# Patient Record
Sex: Male | Born: 1985 | Race: Black or African American | Hispanic: No | Marital: Single | Smoking: Current some day smoker
Health system: Southern US, Community
[De-identification: ages and names within clinical notes are randomized; demographics above are authoritative.]

## PROBLEM LIST (undated history)

## (undated) HISTORY — PX: WRIST FRACTURE SURGERY: SHX121

## (undated) HISTORY — PX: FOOT SURGERY: SHX648

---
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
## 2006-09-18 ENCOUNTER — Emergency Department (HOSPITAL_COMMUNITY): Admission: EM | Admit: 2006-09-18 | Discharge: 2006-09-18 | Payer: Self-pay | Admitting: Emergency Medicine

## 2007-04-13 ENCOUNTER — Emergency Department (HOSPITAL_COMMUNITY): Admission: EM | Admit: 2007-04-13 | Discharge: 2007-04-13 | Payer: Self-pay | Admitting: Emergency Medicine

## 2007-09-01 ENCOUNTER — Emergency Department (HOSPITAL_COMMUNITY): Admission: EM | Admit: 2007-09-01 | Discharge: 2007-09-01 | Payer: Self-pay | Admitting: Emergency Medicine

## 2007-09-10 ENCOUNTER — Ambulatory Visit (HOSPITAL_COMMUNITY): Admission: RE | Admit: 2007-09-10 | Discharge: 2007-09-10 | Payer: Self-pay | Admitting: Orthopedic Surgery
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
## 2008-05-30 ENCOUNTER — Emergency Department (HOSPITAL_COMMUNITY): Admission: EM | Admit: 2008-05-30 | Discharge: 2008-05-30 | Payer: Self-pay | Admitting: Emergency Medicine

## 2008-06-08 ENCOUNTER — Emergency Department (HOSPITAL_COMMUNITY): Admission: EM | Admit: 2008-06-08 | Discharge: 2008-06-08 | Payer: Self-pay | Admitting: Family Medicine

## 2008-08-16 ENCOUNTER — Emergency Department (HOSPITAL_COMMUNITY): Admission: EM | Admit: 2008-08-16 | Discharge: 2008-08-16 | Payer: Self-pay | Admitting: Emergency Medicine

## 2009-04-26 ENCOUNTER — Emergency Department (HOSPITAL_COMMUNITY): Admission: EM | Admit: 2009-04-26 | Discharge: 2009-04-26 | Payer: Self-pay | Admitting: Emergency Medicine

## 2010-09-06 ENCOUNTER — Emergency Department (HOSPITAL_COMMUNITY): Admission: EM | Admit: 2010-09-06 | Discharge: 2010-09-06 | Payer: Self-pay | Admitting: Emergency Medicine
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
## 2011-08-16 LAB — CBC
HCT: 40.4
Hemoglobin: 14.1
RBC: 4.49

## 2011-08-25 LAB — URINALYSIS, ROUTINE W REFLEX MICROSCOPIC
Bilirubin Urine: NEGATIVE
Hgb urine dipstick: NEGATIVE
Ketones, ur: NEGATIVE
Specific Gravity, Urine: 1.024
pH: 6

## 2011-08-25 LAB — URINE MICROSCOPIC-ADD ON

## 2011-08-25 LAB — GC/CHLAMYDIA PROBE AMP, GENITAL: GC Probe Amp, Genital: POSITIVE — AB

## 2013-08-12 ENCOUNTER — Emergency Department (HOSPITAL_COMMUNITY): Admission: EM | Admit: 2013-08-12 | Discharge: 2013-08-12 | Discharge status: Against Medical Advice | Payer: Self-pay

## 2013-10-07 ENCOUNTER — Emergency Department (HOSPITAL_COMMUNITY): Payer: Self-pay

## 2013-10-07 ENCOUNTER — Emergency Department (HOSPITAL_COMMUNITY)
Admission: EM | Admit: 2013-10-07 | Discharge: 2013-10-07 | Disposition: A | Discharge status: Home / Self Care | Payer: Self-pay | Attending: Emergency Medicine | Admitting: Emergency Medicine

## 2013-10-07 ENCOUNTER — Encounter (HOSPITAL_COMMUNITY): Payer: Self-pay | Admitting: Emergency Medicine

## 2013-10-07 DIAGNOSIS — Y9389 Activity, other specified: Secondary | ICD-10-CM | POA: Insufficient documentation

## 2013-10-07 DIAGNOSIS — S62309A Unspecified fracture of unspecified metacarpal bone, initial encounter for closed fracture: Secondary | ICD-10-CM

## 2013-10-07 DIAGNOSIS — S62339A Displaced fracture of neck of unspecified metacarpal bone, initial encounter for closed fracture: Secondary | ICD-10-CM | POA: Insufficient documentation

## 2013-10-07 DIAGNOSIS — F17200 Nicotine dependence, unspecified, uncomplicated: Secondary | ICD-10-CM | POA: Insufficient documentation

## 2013-10-07 DIAGNOSIS — W230XXA Caught, crushed, jammed, or pinched between moving objects, initial encounter: Secondary | ICD-10-CM | POA: Insufficient documentation

## 2013-10-07 DIAGNOSIS — Y929 Unspecified place or not applicable: Secondary | ICD-10-CM | POA: Insufficient documentation

## 2013-10-07 MED ORDER — OXYCODONE-ACETAMINOPHEN 5-325 MG PO TABS
1.0000 | ORAL_TABLET | Freq: Once | ORAL | Status: AC
  Administered 2013-10-07: 1 via ORAL
  Filled 2013-10-07: qty 1

## 2013-10-07 MED ORDER — OXYCODONE-ACETAMINOPHEN 5-325 MG PO TABS: 1.0000 | ORAL_TABLET | ORAL | Status: DC | PRN

## 2013-10-07 NOTE — ED Notes (Signed)
Ortho tech called to apply splint. °
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
## 2013-10-07 NOTE — ED Provider Notes (Signed)
Medical screening examination/treatment/procedure(s) were performed by non-physician practitioner and as supervising physician I was immediately available for consultation/collaboration.  EKG Interpretation   None         Gavin Pound. Oletta Lamas, MD 10/07/13 [phone]

## 2013-10-07 NOTE — ED Provider Notes (Signed)
6:14 AM Patient with possible hand fracture.  Pt to be seen in ED by Dr Amanda Pea.   6:52 AM Pt has been seen by Dr Amanda Pea.  CT pending.  Plan is for volar splint and follow up in his office at 7:30am on Wednesday.    7:49 AM Discussed all results, plan, follow up with patient.  Pt given return precautions.  Pt verbalizes understanding and agrees with plan.     Results for orders placed during the hospital encounter of 09/10/07  CBC      Result Value Range   WBC 6.9     RBC 4.49     Hemoglobin 14.1     HCT 40.4     MCV 90.1     MCHC 34.7     RDW 12.5     Platelets 274     Ct Hand Right Wo Contrast  10/07/2013   CLINICAL DATA:  Right 2nd metacarpal fracture.  EXAM: CT OF THE RIGHT HAND WITHOUT CONTRAST  TECHNIQUE: Multidetector CT imaging was performed according to the standard protocol. Multiplanar CT image reconstructions were also generated.  COMPARISON:  Plain films same day.  FINDINGS: Examination demonstrates a minimally displaced oblique fracture over the mid to distal 2nd metacarpal with extension to the articular surface at the MCP joint. There is approximately 1 cortical with of volar displacement of the distal fragment. There is slight comminution of the fracture at the articular surface. There is mild associated soft tissue swelling. Subtle degenerative change of the 4th MCP joint. Mild bone remodeling suggesting old 4th and 5th metacarpal fractures. Remainder of the exam is unremarkable.  IMPRESSION: Minimally displaced oblique fracture of the distal aspect of the 2nd metacarpal with extension to the articular surface.   Electronically Signed   By: Elberta Fortis M.D.   On: 10/07/2013 07:24   Dg Hand Complete Right  10/07/2013   *RADIOLOGY REPORT*  Clinical Data: Right hand trauma  RIGHT HAND - COMPLETE 3+ VIEW  Comparison: 06/08/2008  Findings: Oblique fracture through the second metacarpal with significant foreshortening and mild radial displacement. Some callus formation is  suspected, therefore this may reflect an acute injury superimposed on a remote trauma.  Evidence of prior fourth metacarpal fracture as well.  No dislocation.  IMPRESSION: Fracture of the second metacarpal as above.   Original Report Authenticated By: Jearld Lesch, M.D.       [place], PA-C 10/07/13 [phone]

## 2014-01-12 ENCOUNTER — Emergency Department (HOSPITAL_COMMUNITY)
Admission: EM | Admit: 2014-01-12 | Discharge: 2014-01-12 | Disposition: A | Discharge status: Home / Self Care | Payer: Self-pay | Attending: Emergency Medicine | Admitting: Emergency Medicine

## 2014-01-12 ENCOUNTER — Encounter (HOSPITAL_COMMUNITY): Payer: Self-pay | Admitting: Emergency Medicine

## 2014-01-12 ENCOUNTER — Emergency Department (HOSPITAL_COMMUNITY): Payer: Self-pay

## 2014-01-12 DIAGNOSIS — Y929 Unspecified place or not applicable: Secondary | ICD-10-CM | POA: Insufficient documentation

## 2014-01-12 DIAGNOSIS — S59919A Unspecified injury of unspecified forearm, initial encounter: Principal | ICD-10-CM

## 2014-01-12 DIAGNOSIS — F17200 Nicotine dependence, unspecified, uncomplicated: Secondary | ICD-10-CM | POA: Insufficient documentation

## 2014-01-12 DIAGNOSIS — S6990XA Unspecified injury of unspecified wrist, hand and finger(s), initial encounter: Principal | ICD-10-CM | POA: Insufficient documentation

## 2014-01-12 DIAGNOSIS — M25549 Pain in joints of unspecified hand: Secondary | ICD-10-CM | POA: Insufficient documentation

## 2014-01-12 DIAGNOSIS — Y939 Activity, unspecified: Secondary | ICD-10-CM | POA: Insufficient documentation

## 2014-01-12 DIAGNOSIS — IMO0002 Reserved for concepts with insufficient information to code with codable children: Secondary | ICD-10-CM | POA: Insufficient documentation

## 2014-01-12 DIAGNOSIS — S6992XA Unspecified injury of left wrist, hand and finger(s), initial encounter: Secondary | ICD-10-CM

## 2014-01-12 DIAGNOSIS — S59909A Unspecified injury of unspecified elbow, initial encounter: Secondary | ICD-10-CM | POA: Insufficient documentation

## 2014-01-12 MED ORDER — HYDROCODONE-ACETAMINOPHEN 5-325 MG PO TABS
2.0000 | ORAL_TABLET | Freq: Once | ORAL | Status: AC
  Administered 2014-01-12: 2 via ORAL
  Filled 2014-01-12: qty 2

## 2014-01-12 MED ORDER — IBUPROFEN 800 MG PO TABS: 800.0000 mg | ORAL_TABLET | Freq: Three times a day (TID) | ORAL | Status: DC

## 2014-01-12 NOTE — ED Notes (Signed)
Significant other at the bedside. Verbalized she would be driving patient home.
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
## 2014-01-12 NOTE — Discharge Instructions (Signed)
Take ibuprofen as needed for pain. Apply ice to the injury. Rest and elevated the injury. Refer to attached documents for more information.

## 2014-01-12 NOTE — ED Provider Notes (Signed)
Medical screening examination/treatment/procedure(s) were performed by non-physician practitioner and as supervising physician I was immediately available for consultation/collaboration.   EKG Interpretation None        Junius ArgyleForrest S Adelie Croswell, MD 01/12/14 2124
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
## 2014-03-23 ENCOUNTER — Encounter (HOSPITAL_COMMUNITY): Payer: Self-pay | Admitting: Emergency Medicine

## 2014-03-23 ENCOUNTER — Emergency Department (HOSPITAL_COMMUNITY): Payer: Self-pay

## 2014-03-23 ENCOUNTER — Emergency Department (HOSPITAL_COMMUNITY)
Admission: EM | Admit: 2014-03-23 | Discharge: 2014-03-23 | Disposition: A | Discharge status: Home / Self Care | Payer: Self-pay | Attending: Emergency Medicine | Admitting: Emergency Medicine

## 2014-03-23 DIAGNOSIS — W108XXA Fall (on) (from) other stairs and steps, initial encounter: Secondary | ICD-10-CM | POA: Insufficient documentation

## 2014-03-23 DIAGNOSIS — F17200 Nicotine dependence, unspecified, uncomplicated: Secondary | ICD-10-CM | POA: Insufficient documentation

## 2014-03-23 DIAGNOSIS — S52609A Unspecified fracture of lower end of unspecified ulna, initial encounter for closed fracture: Secondary | ICD-10-CM

## 2014-03-23 DIAGNOSIS — R296 Repeated falls: Secondary | ICD-10-CM | POA: Insufficient documentation

## 2014-03-23 DIAGNOSIS — S62122A Displaced fracture of lunate [semilunar], left wrist, initial encounter for closed fracture: Secondary | ICD-10-CM

## 2014-03-23 DIAGNOSIS — S62123A Displaced fracture of lunate [semilunar], unspecified wrist, initial encounter for closed fracture: Secondary | ICD-10-CM | POA: Insufficient documentation

## 2014-03-23 DIAGNOSIS — Z9889 Other specified postprocedural states: Secondary | ICD-10-CM | POA: Insufficient documentation

## 2014-03-23 DIAGNOSIS — S52509A Unspecified fracture of the lower end of unspecified radius, initial encounter for closed fracture: Secondary | ICD-10-CM | POA: Insufficient documentation

## 2014-03-23 DIAGNOSIS — Y9389 Activity, other specified: Secondary | ICD-10-CM | POA: Insufficient documentation

## 2014-03-23 DIAGNOSIS — Y9289 Other specified places as the place of occurrence of the external cause: Secondary | ICD-10-CM | POA: Insufficient documentation

## 2014-03-23 MED ORDER — HYDROMORPHONE HCL PF 1 MG/ML IJ SOLN
1.0000 mg | Freq: Once | INTRAMUSCULAR | Status: AC
Start: 2014-03-23 — End: 2014-03-23
  Administered 2014-03-23: 1 mg via INTRAVENOUS
  Filled 2014-03-23: qty 1

## 2014-03-23 MED ORDER — OXYCODONE HCL 5 MG PO TABS: 5.0000 mg | ORAL_TABLET | ORAL | Status: AC | PRN

## 2014-03-23 MED ORDER — HYDROMORPHONE HCL PF 1 MG/ML IJ SOLN
1.0000 mg | Freq: Once | INTRAMUSCULAR | Status: AC
  Administered 2014-03-23: 1 mg via INTRAVENOUS
  Filled 2014-03-23: qty 1

## 2014-03-23 MED ORDER — MORPHINE SULFATE 4 MG/ML IJ SOLN
4.0000 mg | Freq: Once | INTRAMUSCULAR | Status: AC
  Administered 2014-03-23: 4 mg via INTRAVENOUS
  Filled 2014-03-23: qty 1
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
## 2014-03-23 NOTE — ED Notes (Signed)
Patient transported to XR. 

## 2014-03-23 NOTE — ED Notes (Signed)
Patient is off the unit.

## 2014-03-23 NOTE — Discharge Instructions (Signed)
Call to make an appointment with the hand surgeon for Wednesday.  Take oxycodone for breakthrough pain, do not drink alcohol, drive, care for children or do other critical tasks while taking oxycodone.  Do not hesitate to return to the Emergency Department for any new, worsening or concerning symptoms.   If you do not have a primary care doctor you can establish one at the   [facility]: [address] [place] [id number] [phone]  After you establish care. Let them know you were seen in the emergency room. They must obtain records for further management.    Wrist Fracture A wrist fracture is a break in one of the bones of the wrist. Your wrist is made up of several small bones at the palm of your hand (carpal bones) and the two bones that make up your forearm (radius and ulna). The bones come together to form multiple large and small joints. The shape and design of these joints allow your wrist to bend and straighten, move side-to-side, and rotate, as in twisting your palm up or down. CAUSES  A fracture may occur in any of the bones in your wrist when enough force is applied to the wrist, such as when falling down onto an outstretched hand. Severe injuries may occur from a more forceful injury. SYMPTOMS Symptoms of wrist fractures include tenderness, bruising, and swelling. Additionally, the wrist may hang in an odd position or may be misshaped. DIAGNOSIS To diagnose a wrist fracture, your caregiver will physically examine your wrist. Your caregiver may also request an X-ray exam of your wrist. TREATMENT Treatment depends on many factors, including the nature and location of the fracture, your age, and your activity level. Treatment for wrist fracture can be nonsurgical or surgical. For nonsurgical treatment, a plaster cast or splint may be applied to your wrist if the bone is in a good position (aligned). The cast will stay on for about 6 weeks. If the alignment of  your bone is not good, it may be necessary to realign (reduce) it. After the bone is reduced, a splint usually is placed on your wrist to allow for a small amount of normal swelling. After about 1 week, the splint is removed and a cast is added. The cast is removed 2 or 3 weeks later, after the swelling goes down, causing the cast to loosen. Another cast is applied. This cast is removed after about another 2 or 3 weeks, for a total of 4 to 6 weeks of immobilization. Sometimes the position of the bone is so far out of place that surgery is required to apply a device to hold it together as it heals. If the bone cannot be reduced without cutting the skin around the bone (closed reduction), a cut (incision) is made to allow direct access to the bone to reduce it (open reduction). Depending on the fracture, there are a number of options for holding the bone in place while it heals, including a cast, metal pins, a plate and screws, and a device called an external fixator. With an external fixator, most of the hardware remains outside of the body. HOME CARE INSTRUCTIONS  To lessen swelling, keep your injured wrist elevated and move your fingers as much as possible.  Apply ice to your wrist for the first 1 to 2 days after you have been treated or as directed by your caregiver. Applying ice helps to reduce inflammation and pain.  Put ice in a plastic bag.  Place  a towel between your skin and the bag.  Leave the ice on for 15 to 20 minutes at a time, every 2 hours while you are awake.  Do not put pressure on any part of your cast or splint. It may break.  Use a plastic bag to protect your cast or splint from water while bathing or showering. Do not lower your cast or splint into water.  Only take over-the-counter or prescription medicines for pain as directed by your caregiver. SEEK IMMEDIATE MEDICAL CARE IF:   Your cast or splint gets damaged or breaks.  You have continued severe pain or more swelling  than you did before the cast was put on.  Your skin or fingernails below the injury turn blue or gray or feel cold or numb.  You develop decreased feeling in your fingers. MAKE SURE YOU:  Understand these instructions.  Will watch your condition.  Will get help right away if you are not doing well or get worse. Document Released: 08/03/2005 Document Revised: 01/16/2012 Document Reviewed: 11/11/2011 [facility] Patient Information 2014 [place] [place].

## 2014-03-23 NOTE — ED Notes (Signed)
After administration of pain medication 02sats 89% ra. 2LPM via nasal cannula applied now 02sats 99%.

## 2014-03-25 NOTE — ED Provider Notes (Signed)
Medical screening examination/treatment/procedure(s) were performed by non-physician practitioner and as supervising physician I was immediately available for consultation/collaboration.   EKG Interpretation None        Honore Wipperfurth W. Kooper Godshall, MD 03/25/14 0733 

## 2014-08-26 ENCOUNTER — Other Ambulatory Visit (HOSPITAL_COMMUNITY)
Admission: RE | Admit: 2014-08-26 | Discharge: 2014-08-26 | Disposition: A | Discharge status: Home / Self Care | Payer: Self-pay | Source: Ambulatory Visit | Attending: Family Medicine | Admitting: Family Medicine

## 2014-08-26 ENCOUNTER — Emergency Department (INDEPENDENT_AMBULATORY_CARE_PROVIDER_SITE_OTHER)
Admission: EM | Admit: 2014-08-26 | Discharge: 2014-08-26 | Disposition: A | Discharge status: Home / Self Care | Payer: Self-pay | Source: Home / Self Care

## 2014-08-26 ENCOUNTER — Encounter (HOSPITAL_COMMUNITY): Payer: Self-pay | Admitting: Emergency Medicine

## 2014-08-26 DIAGNOSIS — Z113 Encounter for screening for infections with a predominantly sexual mode of transmission: Secondary | ICD-10-CM | POA: Insufficient documentation

## 2014-08-26 DIAGNOSIS — Z202 Contact with and (suspected) exposure to infections with a predominantly sexual mode of transmission: Secondary | ICD-10-CM

## 2014-08-26 DIAGNOSIS — R369 Urethral discharge, unspecified: Secondary | ICD-10-CM

## 2014-08-26 DIAGNOSIS — R300 Dysuria: Secondary | ICD-10-CM

## 2014-08-26 LAB — POCT URINALYSIS DIP (DEVICE)
Bilirubin Urine: NEGATIVE
Glucose, UA: NEGATIVE mg/dL
KETONES UR: NEGATIVE mg/dL
Nitrite: NEGATIVE
PH: 6 (ref 5.0–8.0)
PROTEIN: NEGATIVE mg/dL
Specific Gravity, Urine: 1.025 (ref 1.005–1.030)
Urobilinogen, UA: 0.2 mg/dL (ref 0.0–1.0)

## 2014-08-26 MED ORDER — AZITHROMYCIN 250 MG PO TABS
1000.0000 mg | ORAL_TABLET | Freq: Every day | ORAL | Status: DC
  Administered 2014-08-26 (×2): 1000 mg via ORAL

## 2014-08-26 MED ORDER — CEFTRIAXONE SODIUM 250 MG IJ SOLR
INTRAMUSCULAR | Status: AC
  Filled 2014-08-26: qty 250

## 2014-08-26 MED ORDER — LIDOCAINE HCL (PF) 1 % IJ SOLN
INTRAMUSCULAR | Status: AC
  Filled 2014-08-26: qty 5

## 2014-08-26 MED ORDER — AZITHROMYCIN 250 MG PO TABS
ORAL_TABLET | ORAL | Status: AC
  Filled 2014-08-26: qty 4

## 2014-08-26 MED ORDER — CEFTRIAXONE SODIUM 250 MG IJ SOLR
250.0000 mg | Freq: Once | INTRAMUSCULAR | Status: AC
  Administered 2014-08-26: 250 mg via INTRAMUSCULAR

## 2014-08-26 NOTE — ED Provider Notes (Signed)
CSN: [id number]     Arrival date & time 08/26/14  1509 History   First MD Initiated Contact with Patient 08/26/14 1521     Chief Complaint  Patient presents with  . Penile Discharge   (Consider location/radiation/quality/duration/timing/severity/associated sxs/prior Treatment) HPI Comments: C/O penile discharge and burning with urination since last PM. Sexually active. Partners have not reported any known STD's   History reviewed. No pertinent past medical history. Past Surgical History  Procedure Laterality Date  . Wrist fracture surgery     History reviewed. No pertinent family history. History  Substance Use Topics  . Smoking status: Current Some Day Smoker -- 0.50 packs/day  . Smokeless tobacco: Not on file  . Alcohol Use: Yes    Review of Systems  Constitutional: Negative for fever and fatigue.  Respiratory: Negative.   Gastrointestinal: Negative for nausea, vomiting and abdominal pain.  Genitourinary: Positive for dysuria and discharge. Negative for urgency, frequency, flank pain, decreased urine volume, penile swelling, scrotal swelling, genital sores, penile pain and testicular pain.  Skin: Negative.     Allergies  Review of patient's allergies indicates no known allergies.  Home Medications   Prior to Admission medications   Medication Sig Start Date End Date Taking? Authorizing Provider  oxyCODONE (ROXICODONE) 5 MG immediate release tablet Take 1 tablet (5 mg total) by mouth every 4 (four) hours as needed. Take 1-2 tablets every 4-6 hours as needed for pain control 03/23/14   Joni ReiningNicole Pisciotta, PA-C   BP 137/82  Pulse 83  Temp(Src) 98.7 F (37.1 C) (Oral)  Resp 20  SpO2 99% Physical Exam  Nursing note and vitals reviewed. Constitutional: He is oriented to person, place, and time. He appears well-developed and well-nourished. No distress.  Neck: Normal range of motion.  Pulmonary/Chest: Effort normal. No respiratory distress.  Genitourinary:  NEMG. No  observed penile or scrotal lesions. No expressed urethral D/C. No genital tenderness or testicular enlargement. No abnormal scrotal contents or tenderness. No inguinal tenderness or palpable lymph nodes. No swelling or discoloration.  Neurological: He is alert and oriented to person, place, and time. He exhibits normal muscle tone.  Skin: Skin is warm and dry.  Psychiatric: He has a normal mood and affect.    ED Course  Procedures (including critical care time) Labs Review Labs Reviewed  POCT URINALYSIS DIP (DEVICE) - Abnormal; Notable for the following:    Hgb urine dipstick TRACE (*)    Leukocytes, UA SMALL (*)    All other components within normal limits  URINE CYTOLOGY ANCILLARY ONLY   Results for orders placed during the hospital encounter of 08/26/14  POCT URINALYSIS DIP (DEVICE)      Result Value Ref Range   Glucose, UA NEGATIVE  NEGATIVE mg/dL   Bilirubin Urine NEGATIVE  NEGATIVE   Ketones, ur NEGATIVE  NEGATIVE mg/dL   Specific Gravity, Urine 1.025  1.005 - 1.030   Hgb urine dipstick TRACE (*) NEGATIVE   pH 6.0  5.0 - 8.0   Protein, ur NEGATIVE  NEGATIVE mg/dL   Urobilinogen, UA 0.2  0.0 - 1.0 mg/dL   Nitrite NEGATIVE  NEGATIVE   Leukocytes, UA SMALL (*) NEGATIVE    Imaging Review No results found.   MDM   1. Potential exposure to STD   2. Abnormal penile discharge   3. Dysuria    Rocephin 250 mg IM Azithromycin 1gm po now Urine cytology pending Urine culture pending.    Hayden Rasmussenavid Ame Heagle, NP 08/26/14 [phone]

## 2014-08-26 NOTE — Discharge Instructions (Signed)
Sexually Transmitted Disease °A sexually transmitted disease (STD) is a disease or infection that may be passed (transmitted) from person to person, usually during sexual activity. This may happen by way of saliva, semen, blood, vaginal mucus, or urine. Common STDs include:  °· Gonorrhea.   °· Chlamydia.   °· Syphilis.   °· HIV and AIDS.   °· Genital herpes.   °· Hepatitis B and C.   °· Trichomonas.   °· Human papillomavirus (HPV).   °· Pubic lice.   °· Scabies. °· Mites. °· Bacterial vaginosis. °WHAT ARE CAUSES OF STDs? °An STD may be caused by bacteria, a virus, or parasites. STDs are often transmitted during sexual activity if one person is infected. However, they may also be transmitted through nonsexual means. STDs may be transmitted after:  °· Sexual intercourse with an infected person.   °· Sharing sex toys with an infected person.   °· Sharing needles with an infected person or using unclean piercing or tattoo needles. °· Having intimate contact with the genitals, mouth, or rectal areas of an infected person.   °· Exposure to infected fluids during birth. °WHAT ARE THE SIGNS AND SYMPTOMS OF STDs? °Different STDs have different symptoms. Some people may not have any symptoms. If symptoms are present, they may include:  °· Painful or bloody urination.   °· Pain in the pelvis, abdomen, vagina, anus, throat, or eyes.   °· A skin rash, itching, or irritation. °· Growths, ulcerations, blisters, or sores in the genital and anal areas. °· Abnormal vaginal discharge with or without bad odor.   °· Penile discharge in men.   °· Fever.   °· Pain or bleeding during sexual intercourse.   °· Swollen glands in the groin area.   °· Yellow skin and eyes (jaundice). This is seen with hepatitis.   °· Swollen testicles. °· Infertility. °· Sores and blisters in the mouth. °HOW ARE STDs DIAGNOSED? °To make a diagnosis, your health care provider may:  °· Take a medical history.   °· Perform a physical exam.   °· Take a sample of  any discharge to examine. °· Swab the throat, cervix, opening to the penis, rectum, or vagina for testing. °· Test a sample of your first morning urine.   °· Perform blood tests.   °· Perform a Pap test, if this applies.   °· Perform a colposcopy.   °· Perform a laparoscopy.   °HOW ARE STDs TREATED? ° Treatment depends on the STD. Some STDs may be treated but not cured.  °· Chlamydia, gonorrhea, trichomonas, and syphilis can be cured with antibiotic medicine.   °· Genital herpes, hepatitis, and HIV can be treated, but not cured, with prescribed medicines. The medicines lessen symptoms.   °· Genital warts from HPV can be treated with medicine or by freezing, burning (electrocautery), or surgery. Warts may come back.   °· HPV cannot be cured with medicine or surgery. However, abnormal areas may be removed from the cervix, vagina, or vulva.   °· If your diagnosis is confirmed, your recent sexual partners need treatment. This is true even if they are symptom-free or have a negative culture or evaluation. They should not have sex until their health care providers say it is okay. °HOW CAN I REDUCE MY RISK OF GETTING AN STD? °Take these steps to reduce your risk of getting an STD: °· Use latex condoms, dental dams, and water-soluble lubricants during sexual activity. Do not use petroleum jelly or oils. °· Avoid having multiple sex partners. °· Do not have sex with someone who has other sex partners. °· Do not have sex with anyone you do not know or who is at   high risk for an STD. °· Avoid risky sex practices that can break your skin. °· Do not have sex if you have open sores on your mouth or skin. °· Avoid drinking too much alcohol or taking illegal drugs. Alcohol and drugs can affect your judgment and put you in a vulnerable position. °· Avoid engaging in oral and anal sex acts. °· Get vaccinated for HPV and hepatitis. If you have not received these vaccines in the past, talk to your health care provider about whether one  or both might be right for you.   °· If you are at risk of being infected with HIV, it is recommended that you take a prescription medicine daily to prevent HIV infection. This is called pre-exposure prophylaxis (PrEP). You are considered at risk if: °¨ You are a man who has sex with other men (MSM). °¨ You are a heterosexual man or woman and are sexually active with more than one partner. °¨ You take drugs by injection. °¨ You are sexually active with a partner who has HIV. °· Talk with your health care provider about whether you are at high risk of being infected with HIV. If you choose to begin PrEP, you should first be tested for HIV. You should then be tested every 3 months for as long as you are taking PrEP.   °WHAT SHOULD I DO IF I THINK I HAVE AN STD? °· See your health care provider.   °· Tell your sexual partner(s). They should be tested and treated for any STDs. °· Do not have sex until your health care provider says it is okay.  °WHEN SHOULD I GET IMMEDIATE MEDICAL CARE? °Contact your health care provider right away if:  °· You have severe abdominal pain. °· You are a man and notice swelling or pain in your testicles. °· You are a woman and notice swelling or pain in your vagina. °Document Released: 01/14/2003 Document Revised: 10/29/2013 Document Reviewed: 05/14/2013 °ExitCare® Patient Information ©2015 [employer]. This information is not intended to replace advice given to you by your health care provider. Make sure you discuss any questions you have with your health care provider. ° °Safe Sex °Safe sex is about reducing the risk of giving or getting a sexually transmitted disease (STD). STDs are spread through sexual contact involving the genitals, mouth, or rectum. Some STDs can be cured and others cannot. Safe sex can also prevent unintended pregnancies.  °WHAT ARE SOME SAFE SEX PRACTICES? °· Limit your sexual activity to only one partner who is having sex with only you. °· Talk to your partner  about his or her past partners, past STDs, and drug use. °· Use a condom every time you have sexual intercourse. This includes vaginal, oral, and anal sexual activity. Both females and males should wear condoms during oral sex. Only use latex or polyurethane condoms and water-based lubricants. Using petroleum-based lubricants or oils to lubricate a condom will weaken the condom and increase the chance that it will break. The condom should be in place from the beginning to the end of sexual activity. Wearing a condom reduces, but does not completely eliminate, your risk of getting or giving an STD. STDs can be spread by contact with infected body fluids and skin. °· Get vaccinated for hepatitis B and HPV. °· Avoid alcohol and recreational drugs, which can affect your judgment. You may forget to use a condom or participate in high-risk sex. °· For females, avoid douching after sexual intercourse. Douching can spread an infection   farther into the reproductive tract. °· Check your body for signs of sores, blisters, rashes, or unusual discharge. See your health care provider if you notice any of these signs. °· Avoid sexual contact if you have symptoms of an infection or are being treated for an STD. If you or your partner has herpes, avoid sexual contact when blisters are present. Use condoms at all other times. °· If you are at risk of being infected with HIV, it is recommended that you take a prescription medicine daily to prevent HIV infection. This is called pre-exposure prophylaxis (PrEP). You are considered at risk if: °¨ You are a man who has sex with other men (MSM). °¨ You are a heterosexual man or woman who is sexually active with more than one partner. °¨ You take drugs by injection. °¨ You are sexually active with a partner who has HIV. °· Talk with your health care provider about whether you are at high risk of being infected with HIV. If you choose to begin PrEP, you should first be tested for HIV. You  should then be tested every 3 months for as long as you are taking PrEP. °· See your health care provider for regular screenings, exams, and tests for other STDs. Before having sex with a new partner, each of you should be screened for STDs and should talk about the results with each other. °WHAT ARE THE BENEFITS OF SAFE SEX?  °· There is less chance of getting or giving an STD. °· You can prevent unwanted or unintended pregnancies. °· By discussing safe sex concerns with your partner, you may increase feelings of intimacy, comfort, trust, and honesty between the two of you. °Document Released: 12/01/2004 Document Revised: 03/10/2014 Document Reviewed: 04/16/2012 °ExitCare® Patient Information ©2015 [employer]. This information is not intended to replace advice given to you by your health care provider. Make sure you discuss any questions you have with your health care provider. ° °

## 2014-08-26 NOTE — ED Notes (Signed)
C/o pelvic and abdominal pain x 2 days.  On set of penile discharge last night.  Has concerns for std exposure.  Denies fever, n/v/d

## 2014-08-27 ENCOUNTER — Telehealth (HOSPITAL_COMMUNITY): Payer: Self-pay | Admitting: *Deleted

## 2014-08-27 LAB — URINE CULTURE
Colony Count: NO GROWTH
Culture: NO GROWTH
SPECIAL REQUESTS: NORMAL

## 2014-08-27 LAB — URINE CYTOLOGY ANCILLARY ONLY
Chlamydia: POSITIVE — AB
Neisseria Gonorrhea: POSITIVE — AB
Trichomonas: NEGATIVE

## 2014-08-27 NOTE — ED Notes (Signed)
GC/Chlamydia pos., Urine culture: No growth. I called pt. Pt. verified x 2 and given results.  Pt. told that he was adequately treated with Rocephin and Zithromax. Pt. instructed to notify his partner, no sex for 1 week and to practice safe sex. Pt. told [facility], by appointment.  Pt. voiced understanding. DHHS forms x 2 completed and [facility]. Vassie MoselleYork, Haron Beilke M 08/27/2014

## 2014-08-29 NOTE — ED Provider Notes (Signed)
Medical screening examination/treatment/procedure(s) were performed by resident physician or non-physician practitioner and as supervising physician I was immediately available for consultation/collaboration.   Ruvim Risko DOUGLAS MD.   Jayanna Kroeger D Shahram Alexopoulos, MD 08/29/14 1003 

## 2016-04-20 ENCOUNTER — Emergency Department (HOSPITAL_COMMUNITY)
Admission: EM | Admit: 2016-04-20 | Discharge: 2016-04-20 | Disposition: A | Discharge status: Home / Self Care | Payer: Self-pay | Attending: Emergency Medicine | Admitting: Emergency Medicine

## 2016-04-20 ENCOUNTER — Encounter (HOSPITAL_COMMUNITY): Payer: Self-pay | Admitting: *Deleted

## 2016-04-20 DIAGNOSIS — J3489 Other specified disorders of nose and nasal sinuses: Secondary | ICD-10-CM | POA: Insufficient documentation

## 2016-04-20 DIAGNOSIS — F17200 Nicotine dependence, unspecified, uncomplicated: Secondary | ICD-10-CM | POA: Insufficient documentation

## 2016-04-20 DIAGNOSIS — H109 Unspecified conjunctivitis: Secondary | ICD-10-CM | POA: Insufficient documentation

## 2016-04-20 MED ORDER — NAPHAZOLINE-PHENIRAMINE 0.025-0.3 % OP SOLN: 1.0000 [drp] | OPHTHALMIC | Status: AC | PRN

## 2016-04-20 NOTE — Discharge Instructions (Signed)
Please take your medications as prescribed. Your symptoms are likely due to a virus and will resolve on her own. If your symptoms persist over the next week, return to ED for your PCP for reevaluation. Return sooner for new or worsening symptoms.  How to Use Eye Drops and Eye Ointments HOW TO APPLY EYE DROPS Follow these steps when applying eye drops: 1. Wash your hands. 2. Tilt your head back. 3. Put a finger under your eye and use it to gently pull your lower lid downward. Keep that finger in place. 4. Using your other hand, hold the dropper between your thumb and index finger. 5. Position the dropper just over the edge of the lower lid. Hold it as close to your eye as you can without touching the dropper to your eye. 6. Steady your hand. One way to do this is to lean your index finger against your brow. 7. Look up. 8. Slowly and gently squeeze one drop of medicine into your eye. 9. Close your eye. 10. Place a finger between your lower eyelid and your nose. Press gently for 2 minutes. This increases the amount of time that the medicine is exposed to the eye. It also reduces side effects that can develop if the drop gets into the bloodstream through the nose. HOW TO APPLY EYE OINTMENTS Follow these steps when applying eye ointments: 1. Wash your hands. 2. Put a finger under your eye and use it to gently pull your lower lid downward. Keep that finger in place. 3. Using your other hand, place the tip of the tube between your thumb and index finger with the remaining fingers braced against your cheek or nose. 4. Hold the tube just over the edge of your lower lid without touching the tube to your lid or eyeball. 5. Look up. 6. Line the inner part of your lower lid with ointment. 7. Gently pull up on your upper lid and look down. This will force the ointment to spread over the surface of the eye. 8. Release the upper lid. 9. If you can, close your eyes for 1-2 minutes. Do not rub your eyes. If  you applied the ointment correctly, your vision will be blurry for a few minutes. This is normal. ADDITIONAL INFORMATION  Make sure to use the eye drops or ointment as told by your health care provider.  If you have been told to use both eye drops and an eye ointment, apply the eye drops first, then wait 3-4 minutes before you apply the ointment.  Try not to touch the tip of the dropper or tube to your eye. A dropper or tube that has touched the eye can become contaminated.   This information is not intended to replace advice given to you by your health care provider. Make sure you discuss any questions you have with your health care provider.   Document Released: 01/30/2001 Document Revised: 03/10/2015 Document Reviewed: 10/20/2014 Elsevier Interactive Patient Education [employer]2016 Elsevier Inc.

## 2016-04-20 NOTE — ED Notes (Signed)
Pt complains of pain, itching, redness to left eye since this morning, worse for the past hour. Pt denies vision deficits at this time. Pt denies injury to eye.

## 2016-04-20 NOTE — ED Provider Notes (Signed)
CSN: [id number]     Arrival date & time 04/20/16  1904 History  By signing my name below, I, Phillis HaggisGabriella Gaje, attest that this documentation has been prepared under the direction and in the presence of [employer] MillsBenjamin Alilah Mcmeans, PA-C. Electronically Signed: Phillis HaggisGabriella Gaje, ED Scribe. 04/20/2016. 9:11 PM.   Chief Complaint  Patient presents with  . Eye Pain   The history is provided by the patient. No language interpreter was used.  HPI Comments: Patrick Gay is a 30 y.o. male who presents to the Emergency Department complaining of throbbing, burning left eye pain and redness onset earlier this morning. Pt reports worsening pain over the past hour. He reports associated itching and watery discharge from the eye. He also states rhinorrhea when his eyes have clear discharge. He has used OTC eye drops to the eye to no relief. Pt does not wear glasses or contacts. He denies fever, chills, chest pain, SOB, abdominal pain, visual changes or injury to the eye. He denies hx of known allergies.   History reviewed. No pertinent past medical history. Past Surgical History  Procedure Laterality Date  . Wrist fracture surgery     No family history on file. Social History  Substance Use Topics  . Smoking status: Current Some Day Smoker -- 0.50 packs/day  . Smokeless tobacco: None  . Alcohol Use: Yes    Review of Systems  Constitutional: Negative for fever and chills.  HENT: Positive for rhinorrhea.   Eyes: Positive for pain, redness and itching. Negative for visual disturbance.  Respiratory: Negative for shortness of breath.   Cardiovascular: Negative for chest pain.  Gastrointestinal: Negative for abdominal pain.  All other systems reviewed and are negative.   Allergies  Review of patient's allergies indicates no known allergies.  Home Medications   Prior to Admission medications   Medication Sig Start Date End Date Taking? Authorizing Provider  naphazoline-pheniramine (NAPHCON-A) 0.025-0.3 %  ophthalmic solution Place 1 drop into the left eye every 4 (four) hours as needed for irritation. 04/20/16   Joycie PeekBenjamin Ladislao Cohenour, PA-C  oxyCODONE (ROXICODONE) 5 MG immediate release tablet Take 1 tablet (5 mg total) by mouth every 4 (four) hours as needed. Take 1-2 tablets every 4-6 hours as needed for pain control 03/23/14   Joni ReiningNicole Pisciotta, PA-C   BP 150/102 mmHg  Pulse 57  Temp(Src) 98.6 F (37 C) (Oral)  Resp 18  SpO2 98% Physical Exam  Constitutional: He is oriented to person, place, and time. He appears well-developed and well-nourished. No distress.  HENT:  Head: Normocephalic and atraumatic.  Mouth/Throat: Oropharynx is clear and moist. No oropharyngeal exudate.  Eyes: Conjunctivae are normal. Pupils are equal, round, and reactive to light. Right eye exhibits no nystagmus. Left eye exhibits no nystagmus.  Left eye: mild bulbar conjunctivitis; no consensual pain with light; no overt pain; clear, watery discharge  Neck: Normal range of motion. Neck supple.  Musculoskeletal: Normal range of motion.  Neurological: He is alert and oriented to person, place, and time.  Skin: Skin is warm and dry.  Psychiatric: He has a normal mood and affect. His behavior is normal.  Nursing note and vitals reviewed.   ED Course  Procedures (including critical care time) DIAGNOSTIC STUDIES: Oxygen Saturation is 98% on RA, normal by my interpretation.    COORDINATION OF CARE: 9:10 PM-Discussed treatment plan which includes eye drops with pt at bedside and pt agreed to plan.    Labs Review Labs Reviewed - No data to display  Imaging Review  No results found. I have personally reviewed and evaluated these images and lab results as part of my medical decision-making.   EKG Interpretation None      MDM    Pt dx likely viral conjunctivitis based on presentation & eye exam, no antibiotics are indicated.  No evidence of HSV or VSV infection. Pt is not a contact lens wearer.  Presentation  non-concerning for orbital cellulitis, hyphema, corneal ulcers, corneal abrasions or trauma.  Patient will be discharged home with naphazoline drops every 1 to 2 hours for pruritus.  Patient has been instructed to use cool compresses and practice personal hygiene with frequent hand washing.  Patient understands to follow up with ophthalmology, especially if new symptoms including change in vision, purulent drainage, or entrapment occur.    Final diagnoses:  Conjunctivitis of left eye   I personally performed the services described in this documentation, which was scribed in my presence. The recorded information has been reviewed and is accurate.    Joycie Peek, PA-C 04/20/16 2124  Cathren Laine, MD 04/21/16 [phone]

## 2016-07-18 ENCOUNTER — Emergency Department (HOSPITAL_COMMUNITY)
Admission: EM | Admit: 2016-07-18 | Discharge: 2016-07-18 | Disposition: A | Discharge status: Home / Self Care | Payer: Self-pay | Attending: Emergency Medicine | Admitting: Emergency Medicine

## 2016-07-18 ENCOUNTER — Emergency Department (HOSPITAL_COMMUNITY): Payer: Self-pay

## 2016-07-18 DIAGNOSIS — W228XXA Striking against or struck by other objects, initial encounter: Secondary | ICD-10-CM | POA: Insufficient documentation

## 2016-07-18 DIAGNOSIS — Y92838 Other recreation area as the place of occurrence of the external cause: Secondary | ICD-10-CM | POA: Insufficient documentation

## 2016-07-18 DIAGNOSIS — R519 Headache, unspecified: Secondary | ICD-10-CM

## 2016-07-18 DIAGNOSIS — Y939 Activity, unspecified: Secondary | ICD-10-CM | POA: Insufficient documentation

## 2016-07-18 DIAGNOSIS — F17200 Nicotine dependence, unspecified, uncomplicated: Secondary | ICD-10-CM | POA: Insufficient documentation

## 2016-07-18 DIAGNOSIS — Y999 Unspecified external cause status: Secondary | ICD-10-CM | POA: Insufficient documentation

## 2016-07-18 DIAGNOSIS — R51 Headache: Secondary | ICD-10-CM

## 2016-07-18 DIAGNOSIS — S0083XA Contusion of other part of head, initial encounter: Secondary | ICD-10-CM | POA: Insufficient documentation

## 2016-07-18 MED ORDER — NAPROXEN 500 MG PO TABS: 500.0000 mg | ORAL_TABLET | Freq: Two times a day (BID) | ORAL | 0 refills | Status: AC

## 2016-07-18 MED ORDER — OXYCODONE-ACETAMINOPHEN 5-325 MG PO TABS
1.0000 | ORAL_TABLET | Freq: Once | ORAL | Status: AC
  Administered 2016-07-18: 1 via ORAL
  Filled 2016-07-18: qty 1
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
## 2018-12-20 ENCOUNTER — Encounter (HOSPITAL_COMMUNITY): Payer: Self-pay | Admitting: *Deleted

## 2018-12-20 ENCOUNTER — Other Ambulatory Visit: Payer: Self-pay

## 2018-12-20 DIAGNOSIS — R369 Urethral discharge, unspecified: Secondary | ICD-10-CM | POA: Insufficient documentation

## 2018-12-20 DIAGNOSIS — Z113 Encounter for screening for infections with a predominantly sexual mode of transmission: Secondary | ICD-10-CM | POA: Insufficient documentation

## 2018-12-20 DIAGNOSIS — F17200 Nicotine dependence, unspecified, uncomplicated: Secondary | ICD-10-CM | POA: Insufficient documentation

## 2018-12-20 DIAGNOSIS — Z79899 Other long term (current) drug therapy: Secondary | ICD-10-CM | POA: Insufficient documentation

## 2018-12-20 NOTE — ED Triage Notes (Signed)
Pt noticed yellow discharge form penis last night. ? STD

## 2018-12-21 ENCOUNTER — Emergency Department (HOSPITAL_COMMUNITY)
Admission: EM | Admit: 2018-12-21 | Discharge: 2018-12-21 | Disposition: A | Discharge status: Home / Self Care | Payer: Self-pay | Attending: Emergency Medicine | Admitting: Emergency Medicine

## 2018-12-21 DIAGNOSIS — Z113 Encounter for screening for infections with a predominantly sexual mode of transmission: Secondary | ICD-10-CM

## 2018-12-21 DIAGNOSIS — R369 Urethral discharge, unspecified: Secondary | ICD-10-CM

## 2018-12-21 LAB — GC/CHLAMYDIA PROBE AMP (~~LOC~~) NOT AT ARMC
Chlamydia: NEGATIVE
Chlamydia: NEGATIVE
NEISSERIA GONORRHEA: POSITIVE — AB
Neisseria Gonorrhea: POSITIVE — AB

## 2018-12-21 LAB — URINALYSIS, ROUTINE W REFLEX MICROSCOPIC
Bilirubin Urine: NEGATIVE
GLUCOSE, UA: NEGATIVE mg/dL
Hgb urine dipstick: NEGATIVE
Ketones, ur: 5 mg/dL — AB
Nitrite: NEGATIVE
Protein, ur: NEGATIVE mg/dL
Specific Gravity, Urine: 1.025 (ref 1.005–1.030)
WBC, UA: >50 WBC/hpf — ABNORMAL HIGH (ref 0–5)
pH: 6 (ref 5.0–8.0)

## 2018-12-21 MED ORDER — AZITHROMYCIN 250 MG PO TABS
1000.0000 mg | ORAL_TABLET | Freq: Once | ORAL | Status: AC
  Administered 2018-12-21: 1000 mg via ORAL
  Filled 2018-12-21: qty 4

## 2018-12-21 MED ORDER — CEFTRIAXONE SODIUM 250 MG IJ SOLR
250.0000 mg | Freq: Once | INTRAMUSCULAR | Status: AC
  Administered 2018-12-21: 250 mg via INTRAMUSCULAR
  Filled 2018-12-21: qty 250

## 2018-12-21 MED ORDER — LIDOCAINE HCL 1 % IJ SOLN
INTRAMUSCULAR | Status: AC
  Administered 2018-12-21: 0.9 mL
  Filled 2018-12-21: qty 20

## 2018-12-21 NOTE — ED Notes (Signed)
Bed: WTR8 Expected date:  Expected time:  Means of arrival:  Comments: 

## 2018-12-21 NOTE — ED Provider Notes (Signed)
[facility]-EMERGENCY DEPT Provider Note   CSN: [id number] Arrival date & time: 12/20/18  2303     History   Chief Complaint Chief Complaint  Patient presents with  . SEXUALLY TRANSMITTED DISEASE    HPI Patrick Gay is a 33 y.o. Gay with a hx of no major medical problems presents to the Emergency Department complaining of gradual, persistent, progressively worsening penile discharge onset 2 days ago.  Patient reports he is sexually active with one Gay partner.  He reports he has been sexually active on and off with this partner for the last 13 years.  He reports he was recently released from being incarcerated and return to sexual activity.  He reports he does not always use a condom.  Patient denies history of STD.  He denies dysuria, hematuria, abdominal pain, fever, chills, nausea, vomiting, painful bowel movements.  He denies a history of prostatitis.  No aggravating or alleviating factors.  No treatments prior to arrival.  The history is provided by the patient and medical records. No language interpreter was used.    History reviewed. No pertinent past medical history.  There are no active problems to display for this patient.   Past Surgical History:  Procedure Laterality Date  . FOOT SURGERY    . WRIST FRACTURE SURGERY          Home Medications    Prior to Admission medications   Medication Sig Start Date End Date Taking? Authorizing Provider  naphazoline-pheniramine (NAPHCON-A) 0.025-0.3 % ophthalmic solution Place 1 drop into the left eye every 4 (four) hours as needed for irritation. Patient not taking: Reported on 07/18/2016 04/20/16   Joycie Peek, PA-C  naproxen (NAPROSYN) 500 MG tablet Take 1 tablet (500 mg total) by mouth 2 (two) times daily. 07/18/16   Horton, Mayer Masker, MD  oxyCODONE (ROXICODONE) 5 MG immediate release tablet Take 1 tablet (5 mg total) by mouth every 4 (four) hours as needed. Take 1-2 tablets every 4-6 hours  as needed for pain control Patient not taking: Reported on 07/18/2016 03/23/14   Pisciotta, Joni Reining, PA-C    Family History No family history on file.  Social History Social History   Tobacco Use  . Smoking status: Current Some Day Smoker    Packs/day: 0.50  . Smokeless tobacco: Never Used  Substance Use Topics  . Alcohol use: Yes  . Drug use: No     Allergies   Patient has no known allergies.   Review of Systems Review of Systems  Constitutional: Negative for appetite change, diaphoresis, fatigue, fever and unexpected weight change.  HENT: Negative for mouth sores.   Eyes: Negative for visual disturbance.  Respiratory: Negative for cough, chest tightness, shortness of breath and wheezing.   Cardiovascular: Negative for chest pain.  Gastrointestinal: Negative for abdominal pain, constipation, diarrhea, nausea and vomiting.  Endocrine: Negative for polydipsia, polyphagia and polyuria.  Genitourinary: Positive for discharge. Negative for dysuria, frequency, hematuria and urgency.  Musculoskeletal: Negative for back pain and neck stiffness.  Skin: Negative for rash.  Allergic/Immunologic: Negative for immunocompromised state.  Neurological: Negative for syncope, light-headedness and headaches.  Hematological: Does not bruise/bleed easily.  Psychiatric/Behavioral: Negative for sleep disturbance. The patient is not nervous/anxious.      Physical Exam Updated Vital Signs BP (!) 151/93 (BP Location: Right Arm)   Pulse 73   Temp 98.3 F (36.8 C) (Oral)   Ht 5\' 8"  (1.727 m)   Wt 90.7 kg   SpO2 96%  BMI 30.41 kg/m   Physical Exam Vitals signs and nursing note reviewed. Exam conducted with a chaperone present.  Constitutional:      General: He is not in acute distress.    Appearance: He is well-developed. He is not diaphoretic.     Comments: Awake, alert, nontoxic appearance  HENT:     Head: Normocephalic and atraumatic.     Mouth/Throat:     Pharynx: No  oropharyngeal exudate.  Eyes:     General: No scleral icterus.    Conjunctiva/sclera: Conjunctivae normal.  Neck:     Musculoskeletal: Normal range of motion and neck supple.  Cardiovascular:     Rate and Rhythm: Normal rate and regular rhythm.  Pulmonary:     Effort: Pulmonary effort is normal. No respiratory distress.     Breath sounds: Normal breath sounds. No wheezing.  Abdominal:     General: Bowel sounds are normal.     Palpations: Abdomen is soft. There is no mass.     Tenderness: There is no abdominal tenderness. There is no guarding or rebound.     Hernia: There is no hernia in the right inguinal area or left inguinal area.  Genitourinary:    Pubic Area: No rash.      Penis: Circumcised. Discharge ( Yellow/green) present.      Scrotum/Testes: Normal.        Right: Mass, tenderness or swelling not present. Right testis is descended.        Left: Mass, tenderness or swelling not present. Left testis is descended.     Epididymis:     Right: Normal. No tenderness.     Left: Normal. No tenderness.  Musculoskeletal: Normal range of motion.  Lymphadenopathy:     Lower Body: No right inguinal adenopathy. No left inguinal adenopathy.  Skin:    General: Skin is warm and dry.  Neurological:     Mental Status: He is alert.     Comments: Speech is clear and goal oriented Moves extremities without ataxia      ED Treatments / Results  Labs (all labs ordered are listed, but only abnormal results are displayed) Labs Reviewed  URINALYSIS, ROUTINE W REFLEX MICROSCOPIC - Abnormal; Notable for the following components:      Result Value   APPearance HAZY (*)    Ketones, ur 5 (*)    Leukocytes,Ua MODERATE (*)    WBC, UA >50 (*)    Bacteria, UA RARE (*)    All other components within normal limits  GC/CHLAMYDIA PROBE AMP ([place]) NOT AT [facility]     Procedures Procedures (including critical care time)  Medications Ordered in ED Medications  cefTRIAXone (ROCEPHIN)  injection 250 mg (has no administration in time range)  azithromycin (ZITHROMAX) tablet 1,000 mg (has no administration in time range)     Initial Impression / Assessment and Plan / ED Course  I have reviewed the triage vital signs and the nursing notes.  Pertinent labs & imaging results that were available during my care of the patient were reviewed by me and considered in my medical decision making (see chart for details).     Patient is afebrile without abdominal tenderness, abdominal pain or painful bowel movements to indicate prostatitis.  No tenderness to palpation of the testes or epididymis to suggest orchitis or epididymitis.  STD cultures obtained including gonorrhea and chlamydia. Patient to be discharged with instructions to follow up with PCP. Discussed importance of using protection when sexually active. Pt understands that  they have GC/Chlamydia cultures pending and that they will need to inform all sexual partners if results return positive. Patient has been treated prophylactically with azithromycin and Rocephin.     Final Clinical Impressions(s) / ED Diagnoses   Final diagnoses:  Penile discharge  Encounter for screening examination for sexually transmitted disease    ED Discharge Orders    None       Mardene SayerMuthersbaugh, Boyd KerbsHannah, PA-C 12/21/18 0257    Gilda CreasePollina, Christopher J, MD 12/21/18 [phone]

## 2018-12-21 NOTE — Discharge Instructions (Addendum)
1. Medications: usual home medications °2. Treatment: rest, drink plenty of fluids, use a condom with every sexual encounter °3. Follow Up: Please followup with your primary doctor in 3 days for discussion of your diagnoses and further evaluation after today's visit; if you do not have a primary care doctor use the resource guide provided to find one; Please return to the ER for worsening symptoms, high fevers or persistent vomiting. ° °You have been tested for chlamydia and gonorrhea.  These results will be available in approximately 3 days.  Please inform all sexual partners if you test positive for any of these diseases. ° °

## 2021-04-24 ENCOUNTER — Other Ambulatory Visit: Payer: Self-pay

## 2021-04-24 ENCOUNTER — Emergency Department (HOSPITAL_COMMUNITY)
Admission: EM | Admit: 2021-04-24 | Discharge: 2021-04-24 | Disposition: A | Discharge status: Home / Self Care | Payer: Self-pay | Attending: Emergency Medicine | Admitting: Emergency Medicine

## 2021-04-24 ENCOUNTER — Encounter (HOSPITAL_COMMUNITY): Payer: Self-pay | Admitting: *Deleted

## 2021-04-24 DIAGNOSIS — S90561A Insect bite (nonvenomous), right ankle, initial encounter: Secondary | ICD-10-CM | POA: Insufficient documentation

## 2021-04-24 DIAGNOSIS — F17210 Nicotine dependence, cigarettes, uncomplicated: Secondary | ICD-10-CM | POA: Insufficient documentation

## 2021-04-24 DIAGNOSIS — W57XXXA Bitten or stung by nonvenomous insect and other nonvenomous arthropods, initial encounter: Secondary | ICD-10-CM | POA: Insufficient documentation

## 2021-04-24 MED ORDER — HYDROCORTISONE 1 % EX CREA
TOPICAL_CREAM | Freq: Once | CUTANEOUS | Status: DC
Start: 2021-04-24 — End: 2021-04-24
  Filled 2021-04-24: qty 28

## 2021-04-24 NOTE — Discharge Instructions (Signed)
Apply cortisone cream to area as directed as needed.

## 2021-04-24 NOTE — ED Provider Notes (Signed)
[facility]   CSN: [id number] Arrival date & time: 04/24/21  1730     History No chief complaint on file.   Patrick Gay is a 35 y.o. male.  35 year old male with no segment past medical history presents with complaint of a bug bite to the right posterior ankle onset 15 minutes prior to arrival in the emergency room today.  Reports the area is burning.  No other complaints or concerns today.      No past medical history on file.  There are no problems to display for this patient.   Past Surgical History:  Procedure Laterality Date   FOOT SURGERY     WRIST FRACTURE SURGERY         No family history on file.  Social History   Tobacco Use   Smoking status: Some Days    Packs/day: 0.50    Pack years: 0.00    Types: Cigarettes   Smokeless tobacco: Never  Substance Use Topics   Alcohol use: Yes   Drug use: No    Home Medications Prior to Admission medications   Medication Sig Start Date End Date Taking? Authorizing Provider  naphazoline-pheniramine (NAPHCON-A) 0.025-0.3 % ophthalmic solution Place 1 drop into the left eye every 4 (four) hours as needed for irritation. Patient not taking: Reported on 07/18/2016 04/20/16   Joycie Peek, PA-C  naproxen (NAPROSYN) 500 MG tablet Take 1 tablet (500 mg total) by mouth 2 (two) times daily. 07/18/16   Horton, Mayer Masker, MD  oxyCODONE (ROXICODONE) 5 MG immediate release tablet Take 1 tablet (5 mg total) by mouth every 4 (four) hours as needed. Take 1-2 tablets every 4-6 hours as needed for pain control Patient not taking: Reported on 07/18/2016 03/23/14   Pisciotta, Joni Reining, PA-C    Allergies    Patient has no known allergies.  Review of Systems   Review of Systems  Constitutional:  Negative for fever.  Musculoskeletal:  Negative for arthralgias and myalgias.  Skin:  Positive for rash. Negative for wound.  Allergic/Immunologic: Negative for immunocompromised state.   Neurological:  Negative for weakness and numbness.  Hematological:  Negative for adenopathy.   Physical Exam Updated Vital Signs BP (!) 151/91 (BP Location: Left Arm)   Pulse 81   Temp 97.6 F (36.4 C) (Oral)   Resp 16   SpO2 99%   Physical Exam Vitals and nursing note reviewed.  Constitutional:      General: He is not in acute distress.    Appearance: He is well-developed. He is not diaphoretic.  HENT:     Head: Normocephalic and atraumatic.  Cardiovascular:     Pulses: Normal pulses.  Pulmonary:     Effort: Pulmonary effort is normal.  Musculoskeletal:        General: Swelling present. No tenderness or deformity.  Skin:    General: Skin is warm and dry.     Findings: Erythema present.       Neurological:     Mental Status: He is alert and oriented to person, place, and time.     Sensory: No sensory deficit.     Motor: No weakness.     Gait: Gait normal.  Psychiatric:        Behavior: Behavior normal.    ED Results / Procedures / Treatments   Labs (all labs ordered are listed, but only abnormal results are displayed) Labs Reviewed - No data to display  EKG None  Radiology No results  found.  Procedures Procedures   Medications Ordered in ED Medications  hydrocortisone cream 1 % (has no administration in time range)    ED Course  I have reviewed the triage vital signs and the nursing notes.  Pertinent labs & imaging results that were available during my care of the patient were reviewed by me and considered in my medical decision making (see chart for details).  Clinical Course as of 04/24/21 1743  Sat Apr 24, 2021  163 36 year old otherwise healthy male with complaint of bug bite to the right ankle.  Found to have small area of local reaction to the posterior right ankle.  Plan is to apply cortisone cream and discharge. [LM]    Clinical Course User Index [LM] Alden Hipp   MDM Rules/Calculators/A&P                           Final  Clinical Impression(s) / ED Diagnoses Final diagnoses:  Bug bite, initial encounter    Rx / DC Orders ED Discharge Orders     None        Alden Hipp 04/24/21 1743    Cheryll Cockayne, MD 04/25/21 1547

## 2021-04-24 NOTE — ED Triage Notes (Signed)
Pt complains of itching to right ankle after being bitten by insect.

## 2021-05-01 ENCOUNTER — Other Ambulatory Visit: Payer: Self-pay

## 2021-05-01 ENCOUNTER — Emergency Department (HOSPITAL_COMMUNITY)
Admission: EM | Admit: 2021-05-01 | Discharge: 2021-05-01 | Disposition: A | Discharge status: Home / Self Care | Payer: Self-pay | Attending: Emergency Medicine | Admitting: Emergency Medicine

## 2021-05-01 ENCOUNTER — Encounter (HOSPITAL_COMMUNITY): Payer: Self-pay

## 2021-05-01 ENCOUNTER — Emergency Department (HOSPITAL_COMMUNITY): Payer: Self-pay

## 2021-05-01 DIAGNOSIS — S01511A Laceration without foreign body of lip, initial encounter: Secondary | ICD-10-CM | POA: Insufficient documentation

## 2021-05-01 DIAGNOSIS — M25512 Pain in left shoulder: Secondary | ICD-10-CM | POA: Insufficient documentation

## 2021-05-01 DIAGNOSIS — S43102A Unspecified dislocation of left acromioclavicular joint, initial encounter: Secondary | ICD-10-CM

## 2021-05-01 DIAGNOSIS — F17210 Nicotine dependence, cigarettes, uncomplicated: Secondary | ICD-10-CM | POA: Insufficient documentation

## 2021-05-01 DIAGNOSIS — Y92410 Unspecified street and highway as the place of occurrence of the external cause: Secondary | ICD-10-CM | POA: Insufficient documentation

## 2021-05-01 MED ORDER — OXYCODONE-ACETAMINOPHEN 5-325 MG PO TABS
1.0000 | ORAL_TABLET | Freq: Once | ORAL | Status: AC
  Administered 2021-05-01: 1 via ORAL
  Filled 2021-05-01: qty 1
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
